# Patient Record
Sex: Female | Born: 2000 | Race: White | Hispanic: No | Marital: Single | Smoking: Never smoker
Health system: Southern US, Community
[De-identification: ages and names within clinical notes are randomized; demographics above are authoritative.]

## PROBLEM LIST (undated history)

## (undated) DIAGNOSIS — Z9109 Other allergy status, other than to drugs and biological substances: Secondary | ICD-10-CM

---
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
## 2020-04-01 DIAGNOSIS — Z419 Encounter for procedure for purposes other than remedying health state, unspecified: Secondary | ICD-10-CM | POA: Diagnosis not present

## 2020-05-02 DIAGNOSIS — Z419 Encounter for procedure for purposes other than remedying health state, unspecified: Secondary | ICD-10-CM | POA: Diagnosis not present

## 2020-06-02 DIAGNOSIS — Z419 Encounter for procedure for purposes other than remedying health state, unspecified: Secondary | ICD-10-CM | POA: Diagnosis not present

## 2020-07-02 DIAGNOSIS — Z419 Encounter for procedure for purposes other than remedying health state, unspecified: Secondary | ICD-10-CM | POA: Diagnosis not present

## 2020-08-02 DIAGNOSIS — Z419 Encounter for procedure for purposes other than remedying health state, unspecified: Secondary | ICD-10-CM | POA: Diagnosis not present

## 2020-09-01 DIAGNOSIS — Z419 Encounter for procedure for purposes other than remedying health state, unspecified: Secondary | ICD-10-CM | POA: Diagnosis not present

## 2020-10-02 DIAGNOSIS — Z419 Encounter for procedure for purposes other than remedying health state, unspecified: Secondary | ICD-10-CM | POA: Diagnosis not present

## 2020-11-02 DIAGNOSIS — Z419 Encounter for procedure for purposes other than remedying health state, unspecified: Secondary | ICD-10-CM | POA: Diagnosis not present

## 2020-11-30 DIAGNOSIS — Z419 Encounter for procedure for purposes other than remedying health state, unspecified: Secondary | ICD-10-CM | POA: Diagnosis not present

## 2020-12-31 DIAGNOSIS — Z419 Encounter for procedure for purposes other than remedying health state, unspecified: Secondary | ICD-10-CM | POA: Diagnosis not present

## 2021-01-30 DIAGNOSIS — Z419 Encounter for procedure for purposes other than remedying health state, unspecified: Secondary | ICD-10-CM | POA: Diagnosis not present

## 2021-03-02 DIAGNOSIS — Z419 Encounter for procedure for purposes other than remedying health state, unspecified: Secondary | ICD-10-CM | POA: Diagnosis not present

## 2021-04-01 DIAGNOSIS — Z419 Encounter for procedure for purposes other than remedying health state, unspecified: Secondary | ICD-10-CM | POA: Diagnosis not present

## 2021-05-02 DIAGNOSIS — Z419 Encounter for procedure for purposes other than remedying health state, unspecified: Secondary | ICD-10-CM | POA: Diagnosis not present

## 2021-06-02 DIAGNOSIS — Z419 Encounter for procedure for purposes other than remedying health state, unspecified: Secondary | ICD-10-CM | POA: Diagnosis not present

## 2021-07-02 DIAGNOSIS — Z419 Encounter for procedure for purposes other than remedying health state, unspecified: Secondary | ICD-10-CM | POA: Diagnosis not present

## 2021-08-02 DIAGNOSIS — Z419 Encounter for procedure for purposes other than remedying health state, unspecified: Secondary | ICD-10-CM | POA: Diagnosis not present

## 2021-09-01 DIAGNOSIS — Z419 Encounter for procedure for purposes other than remedying health state, unspecified: Secondary | ICD-10-CM | POA: Diagnosis not present

## 2021-10-02 DIAGNOSIS — Z419 Encounter for procedure for purposes other than remedying health state, unspecified: Secondary | ICD-10-CM | POA: Diagnosis not present

## 2021-11-02 DIAGNOSIS — Z419 Encounter for procedure for purposes other than remedying health state, unspecified: Secondary | ICD-10-CM | POA: Diagnosis not present

## 2021-11-30 DIAGNOSIS — Z419 Encounter for procedure for purposes other than remedying health state, unspecified: Secondary | ICD-10-CM | POA: Diagnosis not present

## 2021-12-31 DIAGNOSIS — Z419 Encounter for procedure for purposes other than remedying health state, unspecified: Secondary | ICD-10-CM | POA: Diagnosis not present

## 2022-01-30 DIAGNOSIS — Z419 Encounter for procedure for purposes other than remedying health state, unspecified: Secondary | ICD-10-CM | POA: Diagnosis not present

## 2022-03-02 DIAGNOSIS — Z419 Encounter for procedure for purposes other than remedying health state, unspecified: Secondary | ICD-10-CM | POA: Diagnosis not present

## 2022-04-01 DIAGNOSIS — Z419 Encounter for procedure for purposes other than remedying health state, unspecified: Secondary | ICD-10-CM | POA: Diagnosis not present

## 2022-05-02 DIAGNOSIS — Z419 Encounter for procedure for purposes other than remedying health state, unspecified: Secondary | ICD-10-CM | POA: Diagnosis not present

## 2022-06-02 DIAGNOSIS — Z419 Encounter for procedure for purposes other than remedying health state, unspecified: Secondary | ICD-10-CM | POA: Diagnosis not present

## 2022-07-02 DIAGNOSIS — Z419 Encounter for procedure for purposes other than remedying health state, unspecified: Secondary | ICD-10-CM | POA: Diagnosis not present

## 2022-08-02 DIAGNOSIS — Z419 Encounter for procedure for purposes other than remedying health state, unspecified: Secondary | ICD-10-CM | POA: Diagnosis not present

## 2022-09-01 DIAGNOSIS — Z419 Encounter for procedure for purposes other than remedying health state, unspecified: Secondary | ICD-10-CM | POA: Diagnosis not present

## 2022-10-02 DIAGNOSIS — Z419 Encounter for procedure for purposes other than remedying health state, unspecified: Secondary | ICD-10-CM | POA: Diagnosis not present

## 2022-11-02 DIAGNOSIS — Z419 Encounter for procedure for purposes other than remedying health state, unspecified: Secondary | ICD-10-CM | POA: Diagnosis not present

## 2022-12-01 DIAGNOSIS — Z419 Encounter for procedure for purposes other than remedying health state, unspecified: Secondary | ICD-10-CM | POA: Diagnosis not present

## 2023-01-01 DIAGNOSIS — Z419 Encounter for procedure for purposes other than remedying health state, unspecified: Secondary | ICD-10-CM | POA: Diagnosis not present

## 2023-01-31 DIAGNOSIS — Z419 Encounter for procedure for purposes other than remedying health state, unspecified: Secondary | ICD-10-CM | POA: Diagnosis not present

## 2023-03-03 DIAGNOSIS — Z419 Encounter for procedure for purposes other than remedying health state, unspecified: Secondary | ICD-10-CM | POA: Diagnosis not present

## 2023-04-02 DIAGNOSIS — Z419 Encounter for procedure for purposes other than remedying health state, unspecified: Secondary | ICD-10-CM | POA: Diagnosis not present

## 2023-05-03 DIAGNOSIS — Z419 Encounter for procedure for purposes other than remedying health state, unspecified: Secondary | ICD-10-CM | POA: Diagnosis not present

## 2023-06-03 DIAGNOSIS — Z419 Encounter for procedure for purposes other than remedying health state, unspecified: Secondary | ICD-10-CM | POA: Diagnosis not present
